# Patient Record
Sex: Male | Born: 1965 | Race: Black or African American | Hispanic: No | Marital: Married | Smoking: Former smoker
Health system: Southern US, Community
[De-identification: ages and names within clinical notes are randomized; demographics above are authoritative.]

## PROBLEM LIST (undated history)

## (undated) DIAGNOSIS — H409 Unspecified glaucoma: Secondary | ICD-10-CM

## (undated) DIAGNOSIS — I10 Essential (primary) hypertension: Secondary | ICD-10-CM

## (undated) HISTORY — DX: Unspecified glaucoma: H40.9

## (undated) HISTORY — DX: Essential (primary) hypertension: I10

---

## 1998-09-26 ENCOUNTER — Emergency Department (HOSPITAL_COMMUNITY): Admission: EM | Admit: 1998-09-26 | Discharge: 1998-09-26 | Payer: Self-pay | Admitting: Emergency Medicine

## 1998-11-08 ENCOUNTER — Emergency Department (HOSPITAL_COMMUNITY): Admission: EM | Admit: 1998-11-08 | Discharge: 1998-11-08 | Payer: Self-pay | Admitting: Emergency Medicine

## 1998-11-15 ENCOUNTER — Emergency Department (HOSPITAL_COMMUNITY): Admission: EM | Admit: 1998-11-15 | Discharge: 1998-11-15 | Payer: Self-pay | Admitting: Emergency Medicine

## 2000-02-08 ENCOUNTER — Emergency Department (HOSPITAL_COMMUNITY): Admission: EM | Admit: 2000-02-08 | Discharge: 2000-02-08 | Payer: Self-pay | Admitting: Emergency Medicine

## 2000-05-30 ENCOUNTER — Emergency Department (HOSPITAL_COMMUNITY): Admission: EM | Admit: 2000-05-30 | Discharge: 2000-05-30 | Payer: Self-pay | Admitting: Emergency Medicine

## 2000-08-05 ENCOUNTER — Encounter: Payer: Self-pay | Admitting: Emergency Medicine

## 2000-08-05 ENCOUNTER — Emergency Department (HOSPITAL_COMMUNITY): Admission: EM | Admit: 2000-08-05 | Discharge: 2000-08-05 | Payer: Self-pay | Admitting: Emergency Medicine

## 2000-10-14 ENCOUNTER — Emergency Department (HOSPITAL_COMMUNITY): Admission: EM | Admit: 2000-10-14 | Discharge: 2000-10-14 | Payer: Self-pay | Admitting: Emergency Medicine

## 2001-07-02 ENCOUNTER — Emergency Department (HOSPITAL_COMMUNITY): Admission: EM | Admit: 2001-07-02 | Discharge: 2001-07-02 | Payer: Self-pay | Admitting: *Deleted

## 2001-07-27 ENCOUNTER — Emergency Department (HOSPITAL_COMMUNITY): Admission: AC | Admit: 2001-07-27 | Discharge: 2001-07-27 | Payer: Self-pay

## 2001-07-28 ENCOUNTER — Emergency Department (HOSPITAL_COMMUNITY): Admission: EM | Admit: 2001-07-28 | Discharge: 2001-07-28 | Payer: Self-pay | Admitting: Emergency Medicine

## 2001-08-06 ENCOUNTER — Emergency Department (HOSPITAL_COMMUNITY): Admission: EM | Admit: 2001-08-06 | Discharge: 2001-08-06 | Payer: Self-pay | Admitting: Emergency Medicine

## 2001-08-26 ENCOUNTER — Emergency Department (HOSPITAL_COMMUNITY): Admission: EM | Admit: 2001-08-26 | Discharge: 2001-08-26 | Payer: Self-pay | Admitting: Emergency Medicine

## 2001-10-06 ENCOUNTER — Emergency Department (HOSPITAL_COMMUNITY): Admission: EM | Admit: 2001-10-06 | Discharge: 2001-10-06 | Payer: Self-pay | Admitting: Emergency Medicine

## 2001-11-20 ENCOUNTER — Inpatient Hospital Stay (HOSPITAL_COMMUNITY): Admission: EM | Admit: 2001-11-20 | Discharge: 2001-11-21 | Payer: Self-pay | Admitting: Emergency Medicine

## 2001-12-04 ENCOUNTER — Emergency Department (HOSPITAL_COMMUNITY): Admission: EM | Admit: 2001-12-04 | Discharge: 2001-12-04 | Payer: Self-pay | Admitting: Emergency Medicine

## 2002-03-08 ENCOUNTER — Emergency Department (HOSPITAL_COMMUNITY): Admission: EM | Admit: 2002-03-08 | Discharge: 2002-03-08 | Payer: Self-pay | Admitting: *Deleted

## 2002-03-09 ENCOUNTER — Encounter: Payer: Self-pay | Admitting: Emergency Medicine

## 2002-03-09 ENCOUNTER — Emergency Department (HOSPITAL_COMMUNITY): Admission: EM | Admit: 2002-03-09 | Discharge: 2002-03-09 | Payer: Self-pay | Admitting: Emergency Medicine

## 2002-03-11 ENCOUNTER — Encounter: Payer: Self-pay | Admitting: Emergency Medicine

## 2002-03-11 ENCOUNTER — Ambulatory Visit (HOSPITAL_COMMUNITY): Admission: RE | Admit: 2002-03-11 | Discharge: 2002-03-11 | Payer: Self-pay | Admitting: Emergency Medicine

## 2002-03-12 ENCOUNTER — Encounter: Payer: Self-pay | Admitting: Surgery

## 2002-03-12 ENCOUNTER — Emergency Department (HOSPITAL_COMMUNITY): Admission: EM | Admit: 2002-03-12 | Discharge: 2002-03-12 | Payer: Self-pay | Admitting: Emergency Medicine

## 2002-03-13 ENCOUNTER — Ambulatory Visit (HOSPITAL_COMMUNITY): Admission: RE | Admit: 2002-03-13 | Discharge: 2002-03-13 | Payer: Self-pay | Admitting: Internal Medicine

## 2011-01-05 ENCOUNTER — Emergency Department: Payer: Self-pay | Admitting: Emergency Medicine

## 2011-08-31 ENCOUNTER — Emergency Department: Payer: Self-pay | Admitting: *Deleted

## 2011-11-12 ENCOUNTER — Emergency Department: Payer: Self-pay | Admitting: Emergency Medicine

## 2012-04-18 ENCOUNTER — Emergency Department: Payer: Self-pay | Admitting: Emergency Medicine

## 2012-12-12 ENCOUNTER — Emergency Department: Payer: Self-pay | Admitting: Emergency Medicine

## 2013-06-24 ENCOUNTER — Inpatient Hospital Stay: Payer: Self-pay | Admitting: Internal Medicine

## 2013-06-24 LAB — COMPREHENSIVE METABOLIC PANEL
Albumin: 3.8 g/dL (ref 3.4–5.0)
Alkaline Phosphatase: 85 U/L
BUN: 16 mg/dL (ref 7–18)
Calcium, Total: 9.1 mg/dL (ref 8.5–10.1)
Co2: 28 mmol/L (ref 21–32)
Creatinine: 1.42 mg/dL — ABNORMAL HIGH (ref 0.60–1.30)
EGFR (African American): >60
EGFR (Non-African Amer.): 58 — ABNORMAL LOW
Glucose: 98 mg/dL (ref 65–99)
Osmolality: 275 (ref 275–301)
Potassium: 4.1 mmol/L (ref 3.5–5.1)
SGOT(AST): 27 U/L (ref 15–37)
SGPT (ALT): 32 U/L (ref 12–78)
Sodium: 137 mmol/L (ref 136–145)

## 2013-06-24 LAB — CBC
HCT: 43.4 % (ref 40.0–52.0)
HGB: 14.9 g/dL (ref 13.0–18.0)
MCHC: 34.3 g/dL (ref 32.0–36.0)
Platelet: 294 10*3/uL (ref 150–440)
RBC: 4.74 10*6/uL (ref 4.40–5.90)
RDW: 12.5 % (ref 11.5–14.5)
WBC: 7.7 10*3/uL (ref 3.8–10.6)

## 2013-06-24 LAB — CK-MB: CK-MB: 3.7 ng/mL — ABNORMAL HIGH (ref 0.5–3.6)

## 2013-06-24 LAB — TROPONIN I: Troponin-I: 0.31 ng/mL — ABNORMAL HIGH

## 2013-06-25 LAB — TROPONIN I: Troponin-I: 0.3 ng/mL — ABNORMAL HIGH

## 2013-06-25 LAB — LIPID PANEL
Ldl Cholesterol, Calc: 23 mg/dL (ref 0–100)
VLDL Cholesterol, Calc: 52 mg/dL — ABNORMAL HIGH (ref 5–40)

## 2013-06-25 LAB — BASIC METABOLIC PANEL
Chloride: 103 mmol/L (ref 98–107)
Co2: 28 mmol/L (ref 21–32)
Creatinine: 1.51 mg/dL — ABNORMAL HIGH (ref 0.60–1.30)
EGFR (Non-African Amer.): 53 — ABNORMAL LOW
Glucose: 109 mg/dL — ABNORMAL HIGH (ref 65–99)
Sodium: 140 mmol/L (ref 136–145)

## 2013-06-25 LAB — CREATININE, SERUM
EGFR (African American): >60
EGFR (Non-African Amer.): >60

## 2013-06-25 LAB — CK TOTAL AND CKMB (NOT AT ARMC): CK, Total: 289 U/L — ABNORMAL HIGH (ref 35–232)

## 2013-09-28 ENCOUNTER — Emergency Department: Payer: Self-pay | Admitting: Internal Medicine

## 2013-09-28 ENCOUNTER — Emergency Department: Payer: Self-pay | Admitting: Emergency Medicine

## 2013-09-28 LAB — COMPREHENSIVE METABOLIC PANEL
ALBUMIN: 3.7 g/dL (ref 3.4–5.0)
ANION GAP: 6 — AB (ref 7–16)
Alkaline Phosphatase: 81 U/L
BUN: 15 mg/dL (ref 7–18)
Bilirubin,Total: 0.2 mg/dL (ref 0.2–1.0)
Calcium, Total: 8.8 mg/dL (ref 8.5–10.1)
Chloride: 106 mmol/L (ref 98–107)
Co2: 26 mmol/L (ref 21–32)
Creatinine: 1.57 mg/dL — ABNORMAL HIGH (ref 0.60–1.30)
EGFR (African American): 59 — ABNORMAL LOW
EGFR (Non-African Amer.): 51 — ABNORMAL LOW
GLUCOSE: 95 mg/dL (ref 65–99)
Osmolality: 276 (ref 275–301)
Potassium: 3.7 mmol/L (ref 3.5–5.1)
SGOT(AST): 19 U/L (ref 15–37)
SGPT (ALT): 35 U/L (ref 12–78)
Sodium: 138 mmol/L (ref 136–145)
Total Protein: 7.5 g/dL (ref 6.4–8.2)

## 2013-09-28 LAB — CBC
HCT: 41.7 % (ref 40.0–52.0)
HGB: 13.9 g/dL (ref 13.0–18.0)
MCH: 31 pg (ref 26.0–34.0)
MCHC: 33.3 g/dL (ref 32.0–36.0)
MCV: 93 fL (ref 80–100)
PLATELETS: 267 10*3/uL (ref 150–440)
RBC: 4.48 10*6/uL (ref 4.40–5.90)
RDW: 13.1 % (ref 11.5–14.5)
WBC: 8.2 10*3/uL (ref 3.8–10.6)

## 2013-09-28 LAB — CK-MB
CK-MB: 5.3 ng/mL — ABNORMAL HIGH (ref 0.5–3.6)
CK-MB: 4.4 ng/mL — ABNORMAL HIGH (ref 0.5–3.6)

## 2013-09-28 LAB — DRUG SCREEN, URINE
Amphetamines, Ur Screen: NEGATIVE (ref ?–1000)
BENZODIAZEPINE, UR SCRN: NEGATIVE (ref ?–200)
Barbiturates, Ur Screen: NEGATIVE (ref ?–200)
CANNABINOID 50 NG, UR ~~LOC~~: POSITIVE (ref ?–50)
Cocaine Metabolite,Ur ~~LOC~~: POSITIVE (ref ?–300)
MDMA (ECSTASY) UR SCREEN: NEGATIVE (ref ?–500)
Methadone, Ur Screen: NEGATIVE (ref ?–300)
Opiate, Ur Screen: NEGATIVE (ref ?–300)
PHENCYCLIDINE (PCP) UR S: NEGATIVE (ref ?–25)
TRICYCLIC, UR SCREEN: NEGATIVE (ref ?–1000)

## 2013-09-28 LAB — TROPONIN I
TROPONIN-I: 0.41 ng/mL — AB
Troponin-I: 0.38 ng/mL — ABNORMAL HIGH

## 2014-07-27 ENCOUNTER — Emergency Department: Payer: Self-pay | Admitting: Emergency Medicine

## 2014-07-30 LAB — BETA STREP CULTURE(ARMC)

## 2014-08-25 IMAGING — CT CT HEAD WITHOUT CONTRAST
1 series · 16 of 30 positions shown, 20 images · non-contrast
Comparison: No priors.

CLINICAL DATA: Headache.

EXAM:
CT HEAD WITHOUT CONTRAST
TECHNIQUE: Contiguous axial images were obtained from the base of the skull
through the vertex without intravenous contrast.

[Series 2: head wo · axial · 0.48mm/px · z∈[-60,+92]mm · 16 of 36 slices shown, 20 images]
[im 2/36  brain]
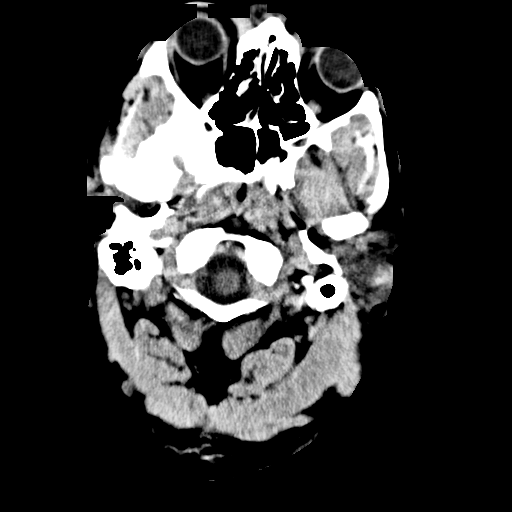
[im 2/36  bone]
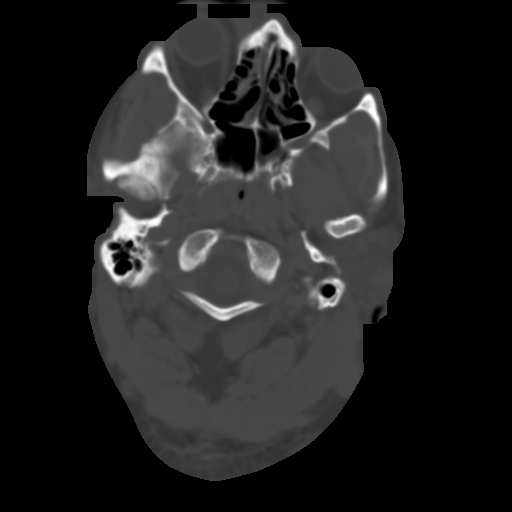
[im 4/36  brain]
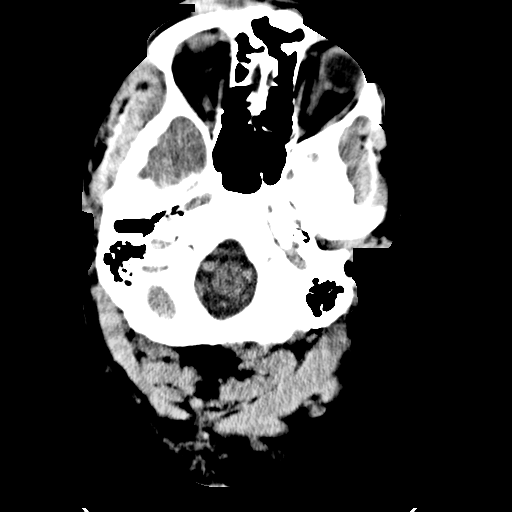
[im 7/36  brain]
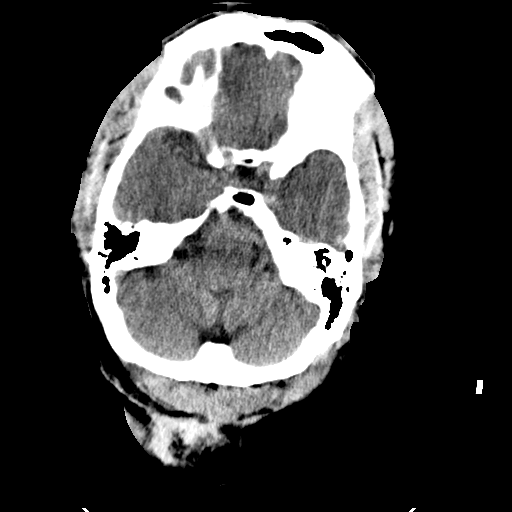
[im 9/36  brain]
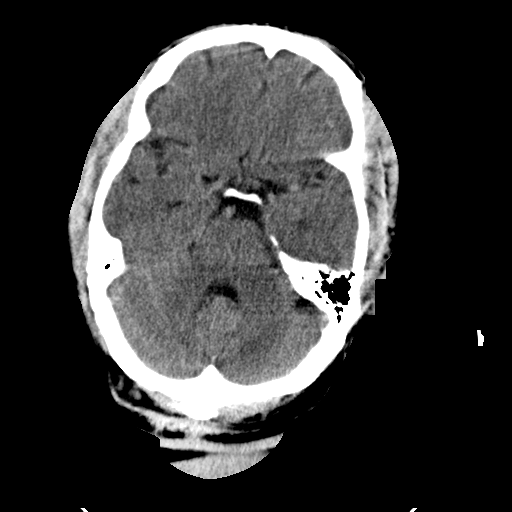
[im 10/36  brain]
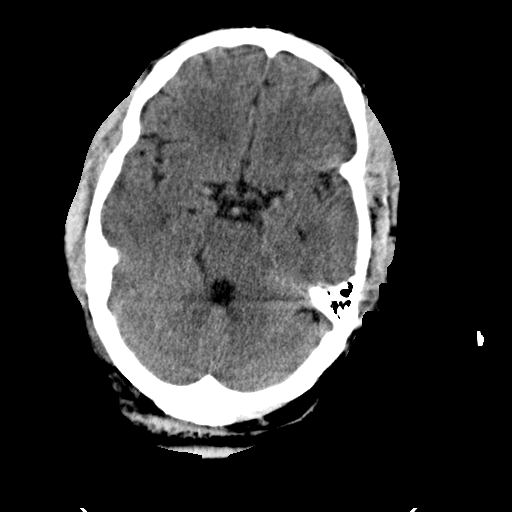
[im 10/36  bone]
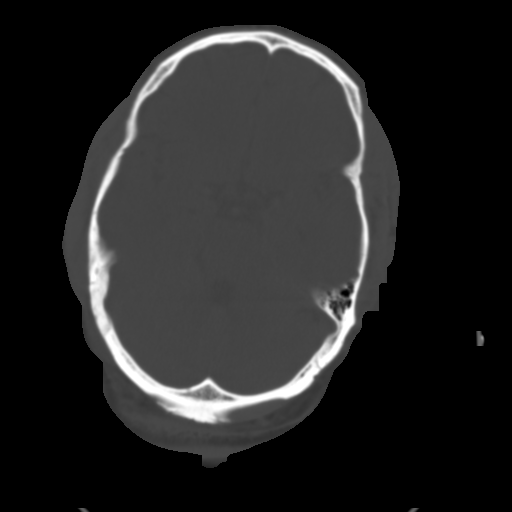
[im 13/36  brain]
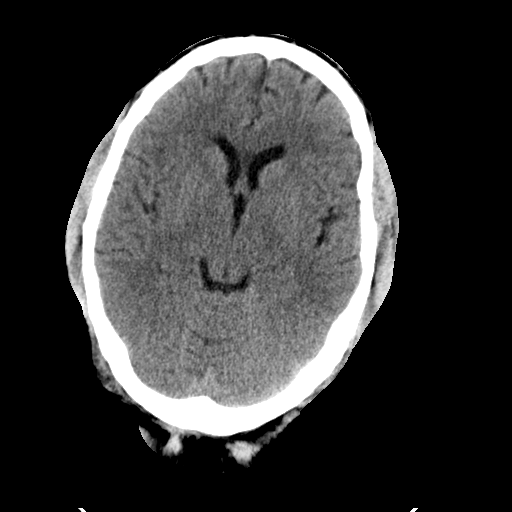
[im 15/36  brain]
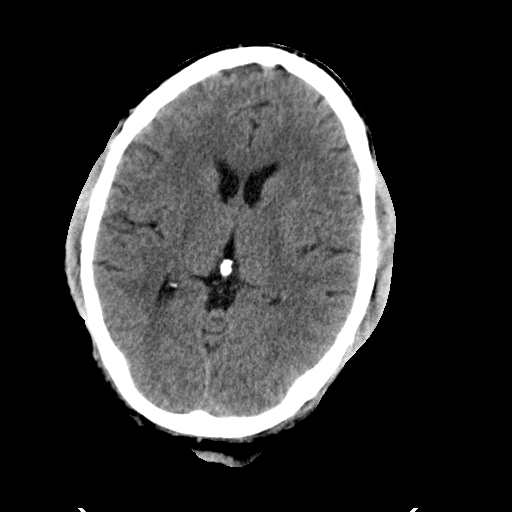
[im 17/36  brain]
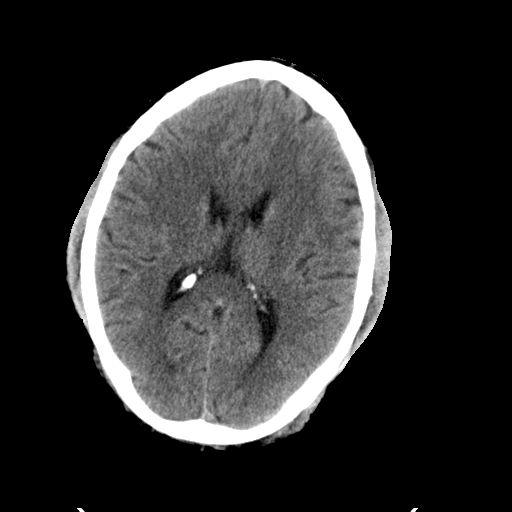
[im 19/36  brain]
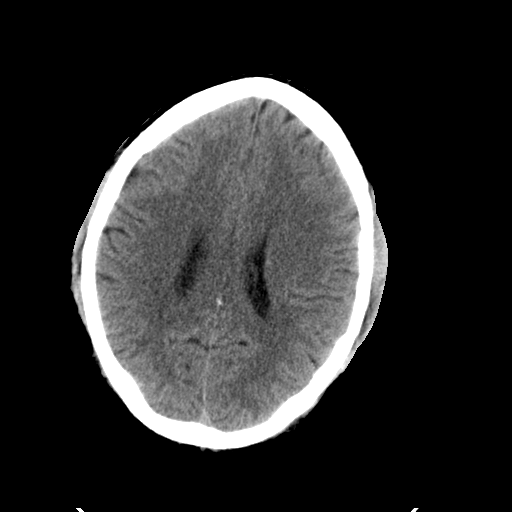
[im 19/36  bone]
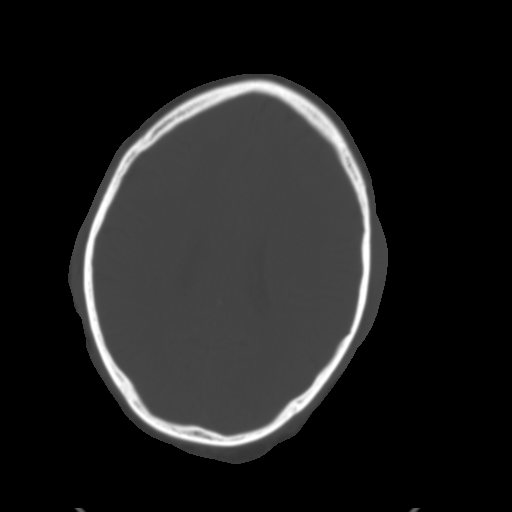
[im 21/36  brain]
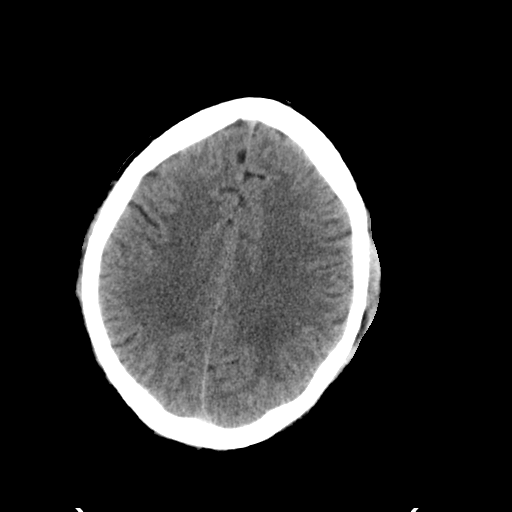
[im 23/36  brain]
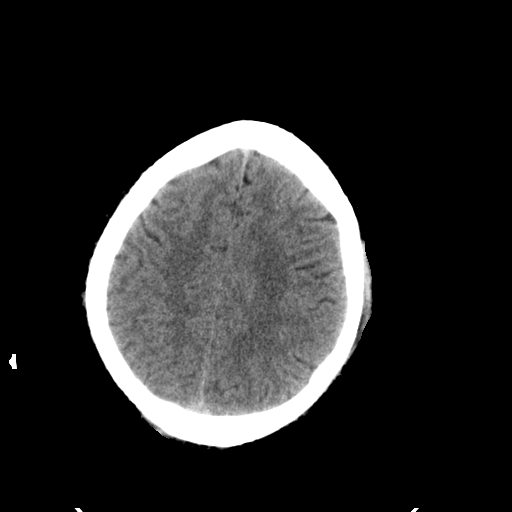
[im 26/36  brain]
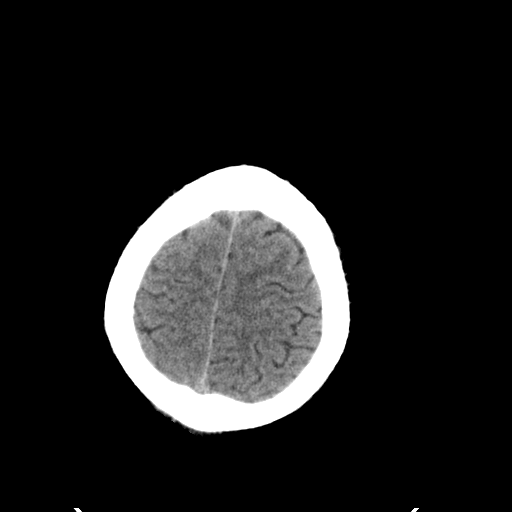
[im 27/36  brain]
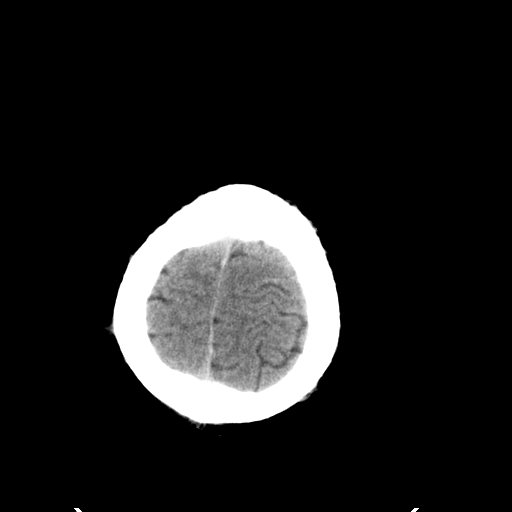
[im 27/36  bone]
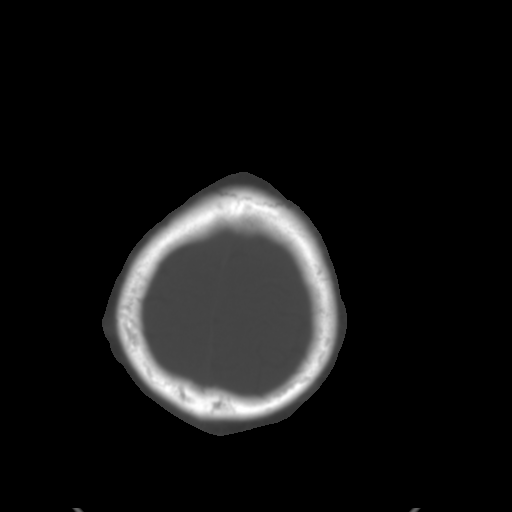
[im 29/36  brain]
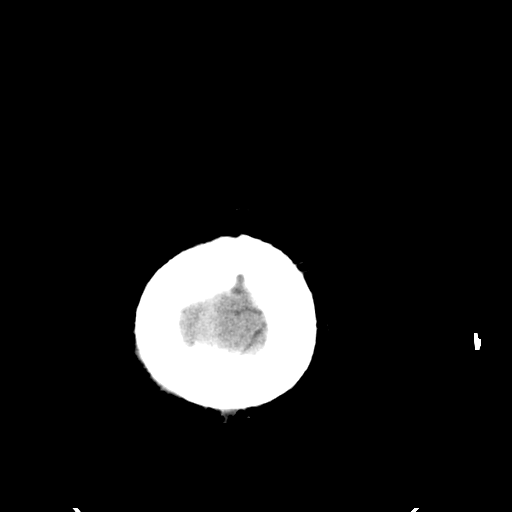
[im 32/36  brain]
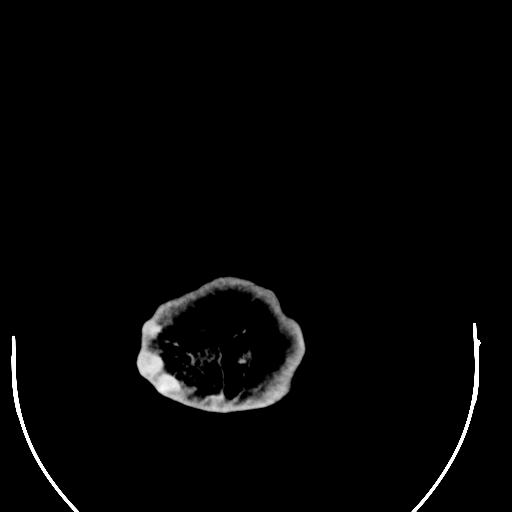
[im 34/36  brain]
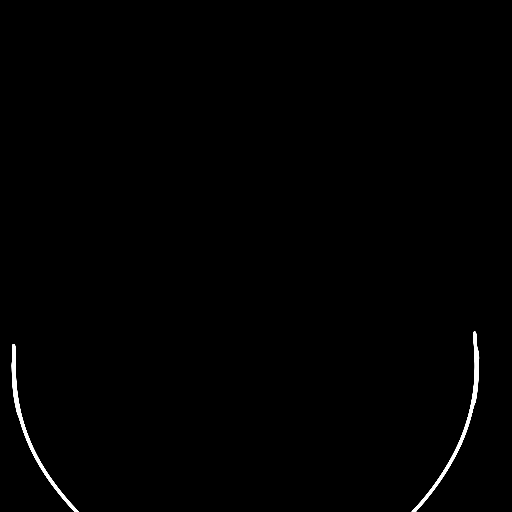

[16 of 30 positions shown; findings below may reference images not displayed]

FINDINGS: No acute intracranial abnormalities. Specifically, no evidence of
acute intracranial hemorrhage, no definite findings of
acute/subacute cerebral ischemia, no mass, mass effect,
hydrocephalus or abnormal intra or extra-axial fluid collections.
Tiny foci of low attenuation in the anterior limb of the left
internal capsule are very well-defined and could either reflect old
lacunar infarctions or dilated perivascular spaces. Visualized
paranasal sinuses and mastoids are well pneumatized. No acute
displaced skull fractures are identified.
IMPRESSION: * No acute intracranial abnormalities.
*Tiny old lacunar infarctions versus dilated perivascular spaces in
the anterior limb of left internal capsule.

## 2014-11-13 NOTE — H&P (Signed)
PATIENT NAMCordella Chung:  [name], Pleasant MR#:  [id number] DATE OF BIRTH:  Aug 26, 1965  DATE OF ADMISSION:  06/24/2013  PRIMARY CARE PROVIDER: None.   ED PHYSICIAN: Rockne MenghiniAnne-Caroline Norman, MD  CHIEF COMPLAINT: Headache.   HISTORY OF PRESENT ILLNESS: The patient is a 49 year old African American male with a long history of hypertension, has not been on blood pressure medication since 2010, when he was released from jail because he states that he ran out of the medications. He reports about a week ago he started having right sharp headache, usually when he woke up in the morning and then would persist for a few hours then go away. The patient continued to have these symptoms and he also has been having shortness of breath with exertion. Therefore, he came to the ED. When he arrived in the ED, his blood pressure was 195/102. The patient underwent evaluation for his symptoms. He also had an EKG which showed inferolateral T wave inversions and his troponin was noted to be 0.37. Therefore, we were asked to admit the patient. The patient denies any chest pain. He does complain of chest pain with exertion. Denies any nausea or vomiting. Denies any left-sided chest pain. Denies any palpitations or syncope. Denies any abdominal pain, nausea, vomiting. Denies any urinary symptoms.   PAST MEDICAL HISTORY: Significant for hypertension since 2008.   PAST SURGICAL HISTORY: None.   ALLERGIES: None.   CURRENT MEDICATIONS: None.   SOCIAL HISTORY: Smokes less than 1 pack per day. Drinks 1 beer once a month, uses marijuana.   FAMILY HISTORY: Uncle with heart disease.   REVIEW OF SYSTEMS: CONSTITUTIONAL: Denies any fevers, fatigue, weakness. Complains of headache. No weight loss or weight gain.  EYES: No blurred or double vision. No pain. No redness. No inflammation. He reports that he has cataract in the left eye.  ENT: Denies any tinnitus, ear pain. No hearing loss. No seasonal or year-round allergies. No epistaxis. No nasal  discharge. No difficulty swallowing.  RESPIRATORY:  Denies any cough, wheezing, hemoptysis.  CARDIOVASCULAR: Denies any chest pain, orthopnea, edema, arrhythmia. Complains of dyspnea on exertion.  GASTROINTESTINAL: No nausea, vomiting, diarrhea. No abdominal pain. No hematemesis. No melena. No ulcer. No GERD. No IBS. No jaundice.  GENITOURINARY: Denies any dysuria, hematuria, renal calc or frequency.  ENDOCRINE: Denies any polyuria, nocturia or thyroid problems.  HEMATOLOGIC AND LYMPHATIC: Denies anemia, easy bruisability or bleeding.  SKIN: No acne. No rash. No changes in hair, mole or skin.  MUSCULOSKELETAL: Denies any pain in the neck, back or shoulder.  NEUROLOGIC: No numbness. No CVA. No TIA.   PSYCHIATRIC: No anxiety. No insomnia. No ADD.   PHYSICAL EXAMINATION: VITAL SIGNS: Temperature 97.2, pulse 80, respirations 20, blood pressure 195/102.  GENERAL: The patient is an obese African American male in no acute distress.  HEENT: Head atraumatic, normocephalic. Pupils equally round, reactive to light and accommodation. There is no conjunctival pallor, no scleral icterus. Nasal exam shows no drainage or ulceration. Oropharynx is clear without any exudate.  NECK: Supple without any JVD.  CARDIOVASCULAR: Regular rate and rhythm. No murmurs, rubs, clicks or gallops. PMI is not displaced.  LUNGS: Clear to auscultation bilaterally without any rales, rhonchi, wheezing.  ABDOMEN: Soft, nontender, nondistended. Positive bowel sounds x 4. No hepatosplenomegaly.  MUSCULOSKELETAL: There is no erythema or swelling.  SKIN: He has some raised boil-like lesions on his back. No other rashes.  LYMPHATICS: No lymph nodes palpable.  MUSCULOSKELETAL: There is no erythema or swelling. NEUROLOGIC: Awake, alert, oriented x  3. No focal deficits.  PSYCHIATRIC: Not anxious or depressed.   LABORATORY, DIAGNOSTIC AND RADIOLOGICAL DATA: EKG shows inferolateral T wave changes. Chest x-ray shows normal chest x-ray.  BMP: Glucose 98, BUN 16, creatinine 1.42, sodium 137, potassium 4.1, chloride 107, CO2 is 28, calcium 9.1. LFTs are normal. CK-MB 3.5. Troponin 0.31. WBC 7.7, hemoglobin 14.9, platelet count was 294.   ASSESSMENT AND PLAN: The patient is a 49 year old African American male with history of hypertension, untreated since 2010, presents with headache and noted to have accelerated hypertension.  1.  Headache due to accelerated hypertension. At this time, we will treat him with clonidine,  HCTZ and atenolol. We will do p.r.n. hydralazine. Hold ACE inhibitor in light of his elevated creatinine.  2.  Elevated cardiac enzymes could be due to accelerated hypertension but does have T wave inversions. Will go ahead and schedule a Leximibi in the a.m. Aspirin therapy.  3.  Nicotine addiction, 4 minutes spent. Nicotine patch offered. The patient is recommended to stop smoking.   NOTE: 50 minutes spent on this H and P.     ____________________________ Lacie Scotts. Allena Katz, MD shp:cs D: 06/24/2013 19:30:02 ET T: 06/24/2013 20:02:03 ET JOB#: [id number]  cc: Breckin Zafar H. Allena Katz, MD, <Dictator> Charise Carwin MD ELECTRONICALLY SIGNED 07/04/2013 8:58

## 2014-11-13 NOTE — Discharge Summary (Signed)
PATIENT NAMEKYNGSTON, [name] MR#:  [id number] DATE OF BIRTH:  21-Dec-1965  DATE OF ADMISSION:  06/24/2013 DATE OF DISCHARGE:  06/25/2013  ADMITTING DIAGNOSIS:  Malignant hypertension.   DISCHARGE DIAGNOSES: 1.  Malignant hypertension.  2.  Headache, likely hypertensive encephalopathy.  3.  Elevated troponin, no acute coronary syndrome.  4.  Acute renal failure, resolving with IV fluids.  5.  Hyperlipidemia.  6.  Hypertriglyceridemia.  7.  Obesity.   DISCHARGE CONDITION: Stable.   DISCHARGE MEDICATIONS: The patient is to start clonidine 0.2 mg p.o. twice daily, atenolol 50 mg p.o. daily, HCTZ 25 mg p.o. once daily, nicotine transdermal patch 21 mg topically daily.   HOME OXYGEN: None.   DIET: Two grams salt, low fat, low cholesterol. The patient was advised to cut out all fried food from his diet.    ACTIVITY LIMITATIONS: As tolerated.    FOLLOWUP APPOINTMENT:  With [facility] in 2 days after discharge present.   CONSULTANTS: Care management, social work.   RADIOLOGIC STUDIES: Chest x-ray, AP and lateral, 2nd of December 2014, showed no active cardiopulmonary disease.   HOSPITAL COURSE:  The patient is a 49 year old African American male with history of hypertension, who presented to the hospital with complaints of headaches. Please refer to Dr. Serita Grit Chung's admission note on 2nd of December 2014. On arrival to the Emergency Room, the patient's blood pressure was noted to be 195/102. His troponin was also noted to be elevated at 0.37. Physical exam was unremarkable. The patient's EKG and showed inferolateral T wave changes, T depressions. The patient's lab data revealed elevation of creatinine to 1.42, otherwise BMP was unremarkable. Liver enzymes were normal. Cardiac enzymes, first set showed troponin of 0.31, second set is 0.31, third set 0.30. The patient's TSH was normal at 0.647. CBC was within normal limits. The patient was admitted to the hospital for further evaluation.  His cardiac enzymes were cycled and he underwent Myoview stress test. If Myoview stress test is unremarkable, normal, the patient will likely be discharged home.   In regards to malignant hypertension, the patient was initiated on blood pressure medications and the patient's blood pressure was much better controlled. On the day of discharge, the patient's blood pressure is 133/89, saturation was 97% on room air at rest. His heart rate was between 60 and 63, and he was afebrile with temperature of 98. He was advised to establish primary physician at the [facility] and be seen there and continue his blood pressure medications.   In regards to acute renal failure, the patient was continued on IV fluids while he was in the hospital.  His creatinine somewhat improved to 1.35 on the day of discharge, 3rd of December 2014, but significantly so.  It was felt that the patient's mild renal insufficiency could have been chronic, not necessarily acute; however, we felt that the patient's creatinine should be checked as outpatient and make decision about nephrology consultation if needed but the most important thing was blood pressure control.   In regards to elevated troponin, cardiologist did not feel that was acute coronary syndrome. Cardiac evaluation was done on the 3rd of December 2014, however, results are still pending at the time of dictation. If stress test is normal, the patient will likely be discharged home today.   In regards to acute renal failure, as mentioned above the patient received some IV fluids while he was in the hospital. He had mild changes in his creatinine levels; however, it is  difficult to say if in fact, his renal failure is acute or chronic. We checked patient's lipid panel since we were concerned about possible cardiovascular disease. The patient's LDL was found to be 23. The patient's total cholesterol was 108, however, triglycerides were markedly elevated at 260 and the HDL was  33. We advised the patient to cut out all fatty food from his diet and lose weight if possible.   In regards to headaches, we felt that the patient's headache was very likely hypertensive encephalopathy. The patient's headache resolved whenever the patient's malignant hypertension was treated.  The patient is being discharged as soon as the patient's Myoview stress test is report is back and if it is negative.   VITAL SIGNS: Of discharge, temperature was 98, pulse was 63. Respiratory rate was 16, blood pressure 133/89, saturation was 97% on room air at rest.    ____________________________ [name] Breanna Shorkey, MD rv:cs D: 06/25/2013 16:15:00 ET T: 06/25/2013 18:59:26 ET JOB#: [id number]  cc: [name] Omah Dewalt, MD, <Dictator> [facility] Henry Altmann MD ELECTRONICALLY SIGNED 07/02/2013 15:34

## 2014-11-14 NOTE — Consult Note (Signed)
PATIENT NAMECORNELIS, [name] MR#:  [id number] DATE OF BIRTH:  Jul 19, 1966  DATE OF CONSULTATION:  09/28/2013  REFERRING PHYSICIAN:  Sheryl L. Mindi Junker, MD, from ER  CONSULTING PHYSICIAN:  Hope Pigeon. Elisabeth Pigeon, MD  CHIEF COMPLAINT: Headache.   HISTORY OF PRESENTING ILLNESS: A 49 year old male who is having a long history of hypertension but has not been on hypertension medication for the last few years as he was initially in jail but last few years, after he was released, he never established a primary care physician and was not taking it. He was in the Emergency Room and admitted to the hospital in December 2014 for similar complaint with uncontrolled hypertension with headache and elevated troponin. Cardiac workup including echocardiogram and stress test was done and they were reported to be negative for any findings other than having hypertrophic cardiomyopathy. He was discharged home with prescriptions of hypertensive medication and advised to follow in clinic but he did not go to any clinic as he did not have insurance and for the last 2 months not taking any medication after he ran out of the prescriptions. Yesterday, he was doing okay but in the evening and nighttime, he started having severe headache and did not have good sleep. He decided to come to the Emergency Room today morning. He also reports that he had cocaine use yesterday. He came to Emergency Room and found having blood pressure was on the high side, systolic was 179 and diastolic was 102. His troponin was noted 0.4 and so he asked to be admitted to hospitalist team for further evaluation. On my questioning, the patient denies any chest pain or shortness of breath or any numbness or weakness, focal areas. He says that he has left shoulder pain but it is chronic and kind of bothers him most of the time and he has to keep his shoulder resting in  position whenever he is sleeping and not moving much to avoid the pain but that is chronic to  him and not changed recently. He denies any visual changes or any dizziness. No nausea and vomiting.   REVIEW OF SYSTEMS:   CONSTITUTIONAL: Negative for fever, fatigue, weakness, pain or weight loss.  EYES: No blurring, double vision, discharge or redness.  EARS, NOSE, THROAT: No tinnitus, ear pain or hearing loss.  RESPIRATORY: No cough, wheezing, shortness of breath.  CARDIOVASCULAR: No chest pain, orthopnea, edema, arrhythmia or palpitations.  GASTROINTESTINAL: No nausea, vomiting, diarrhea, abdominal pain.  GENITOURINARY: No dysuria, hematuria or increased frequency.  ENDOCRINE: No heat or cold intolerance. No increased sweating.  NEUROLOGICAL: No numbness, weakness, tremor or vertigo.  PSYCHIATRIC: No anxiety, insomnia, bipolar disorder.   PAST MEDICAL HISTORY:  Hypertension since 2008, cardiomyopathy due to uncontrolled hypertension as evidenced by echocardiogram in December 2014   PAST SURGICAL HISTORY: None.   CURRENT MEDICATIONS: None.   SOCIAL HISTORY: He was a smoker, 1 pack per day, but stopped smoking since December 2014. Drinks 1 beer occasionally and he uses marijuana and cocaine frequently. Last cocaine use was yesterday.   FAMILY HISTORY: Uncle had heart disease.   PHYSICAL EXAMINATION: VITAL SIGNS: In ER, temperature 97.9, pulse 60, respirations 18, blood pressure 179/102 on presentation which is still 178/91 and oxygen saturation 98% on room air.  GENERAL: The patient is fully alert and oriented to time, place and person. Does not appear in any acute distress.  HEENT: Head and neck atraumatic. Conjunctiva pink. Oral mucosa moist.  NECK: Supple. No JVD.  RESPIRATORY: Bilateral clear  and equal air entry.  CARDIOVASCULAR: S1, S2 present, regular. No murmur.  ABDOMEN: Soft, nontender. Bowel sounds present. No organomegaly.  SKIN: No rashes.  LEGS: No edema.  NEUROLOGICAL: Power 5 out of 5. Follows commands. Moves all 4 limbs. No gross abnormality.  JOINTS: No  swelling or tenderness.  PSYCHIATRIC: Does not appear in any acute psychiatric illness at this time.   LABORATORY, DIAGNOSTIC AND RADIOLOGICAL DATA:  Glucose 95, BUN 15, creatinine 1.57, sodium 138, potassium 3.7, chloride 106, CO2 26, calcium is 8.8. Troponin is 0.41 in the past, in December 2014, his troponin was 0.3 all the time. WBC 8.2, hemoglobin 13.9, platelet count is 267. Echocardiogram was done in December 2014 which is concentric left ventricular hypertrophy. Stress test was done in December 2014 which was negative for any acute ischemia. EKG reviewed by me is having some left ventricular hypertrophic changes and residual T wave abnormality which is same as compared to December 2014, does not appear to be having any new changes in the EKG since then.   ASSESSMENT AND PLAN: A 49 year old male who is hypertensive since long time, not taking medication regularly and having hypertensive cardiomyopathy, concentric left ventricular hypertrophy, noncompliance to medication, came to hospital after having cocaine use last night and having headache and elevated blood pressure. Right now, headache is coming under control with interventions in the ER. Troponin is slightly elevated, same as in the past, and EKG is same as in the past. Negative stress test in the recent past, 3 months ago.  1.  Uncontrolled hypertension. I counseled him to keep taking his blood pressure medication regularly and start following with 1 of the doctors in the clinic.  If he had insurance issues, then he can follow with [facility] which is free to noninsured people and I gave him prescription for his hypertensive medication for next 6 months which he can fill up at  Medicine with pharmacist.   2.  Elevated troponin. This is chronic for him. In December, his troponin was 0.3 all 3 times checked and today it is 0.4. This is most likely due to concentric hypertrophy secondary to hypertension and mild renal failure and uncontrolled  hypertension. EKG is not changed from his baseline so I would recommend to follow 1 more troponin and, if it is not rising, then he can be safely discharged back home.  3.  Chronic renal failure stage 3. His creatinine is always in the range of 0.4 to 0.5 and creatinine clearance is around 50s, compared to previous same. Advised to take blood pressure medication regularly and have followup with his clinic.  Now he claims that he has insurance and he would start following with a doctor regularly.  4.  Cocaine use. Counseled for not using the street drugs and cocaine. He understands the severity of that and agrees to not use it in future. Currently, he does not appear to be in any acute distress because of that and so he can be safely discharged.  5.  Headache. Give 1 dose of morphine for now and that should help to relieve his headache as blood pressure is also coming under control and I am changing his blood pressure medication from atenolol to lisinopril because of his cocaine use and it might not be safe to give him beta blocker with that. Heart rate also running in 50s and 60s in the Emergency Room so would like to avoid beta blockers.   TOTAL TIME SPENT ON THIS CONSULT: Is  45 minutes.   Spoke to ER physician about the patient's condition and plan and he can be discharged after following second troponin. They understand and agree with the plan. Prescriptions will be given to patient on discharge.   Thanks for allowing me to participate in the care of this patient.    ____________________________ Hope Pigeon. Elisabeth Pigeon, MD vgv:cs D: 09/28/2013 11:03:04 ET T: 09/28/2013 14:41:53 ET JOB#: [id number]  cc: Hope Pigeon. Elisabeth Pigeon, MD, <Dictator> Altamese Dilling MD ELECTRONICALLY SIGNED 10/07/2013 22:45

## 2014-11-14 NOTE — Consult Note (Signed)
PATIENT NAMCordella Chung:  Mom, Yehonatan MR#:  [id number] DATE OF BIRTH:  10/07/1965  DATE OF CONSULTATION:  06/25/2013  REFERRING PHYSICIAN:  Dr. Precious BardSharon Patel CONSULTING PHYSICIAN:  Najib Colmenares D. Rhian Asebedo, MD  INDICATION:  Abnormal EKG, malignant hypertension, chest pain.   HISTORY OF PRESENT ILLNESS:  The patient is a 49 year old African American male with a history of hypertension, long-standing blood pressure problems, but has been noncompliant with medications. He has not been able to afford them. The patient came to the Emergency Room with systolic blood pressure around 200 and underwent evaluation and found to have a slightly elevated troponin. He had had mild chest pain and some shortness of breath. No blackout spells or syncope so he has referred for cardiac evaluation. He denies prior cardiac history except for hypertension.   PAST MEDICAL HISTORY:  Hypertension and noncompliance.   PAST SURGICAL HISTORY:  None.   ALLERGIES:  None.   MEDICATIONS:  None.   SOCIAL HISTORY:  A smoker, drinks beer, occasional substance abuser.   FAMILY HISTORY:  Positive for cardiac disease.  REVIEW OF SYSTEMS:  No blackout spells or syncope. Denies nausea or vomiting. Denies fever, chills, sweats, weight loss, weight gain, hemoptysis, hematemesis. No bright-red blood per rectum. No vision change or hearing change. Denies sputum production. Denies cough.   PHYSICAL EXAMINATION:  VITAL SIGNS:  Blood pressure was initially 200/100, pulse of 80, respiratory rate of 16. HEART EXAM:  Systolic ejection murmur. Positive S4. PMI nondisplaced.  ABDOMEN:  Benign.  EXTREMITIES:  Within normal limits.  NEUROLOGIC: Intact.  SKIN: Normal.  LABORATORY AND DIAGNOSTICS:  1.  EKG: Normal sinus rhythm, diffuse ST segment and T-wave changes.  2.  CHEST X-RAY:  Normal: 3.  Glucose 98, BUN is 16, creatinine 1.4, sodium 137, potassium 4.1, chloride of 107, CO2 28, calcium 9.1. LFTs are negative. Troponin 0.31. White count of 7.7,  hemoglobin of 14.9, platelet count of 294.   ASSESSMENT:  1.  Possible angina.  2.  Abnormal EKG. 3.  Malignant hypertension.  4.  Noncompliance.  5.  Substance abuse.  6.  Elevated troponin with possible non-Q-wave myocardial infarction. 7.  Smoking.   PLAN:  1.  Agree with admit. Rule out for myocardial infarction. Followup cardiac enzymes. Followup EKGs. Consider anticoagulation. Consider functional study versus cardiac catheterization.  2.  For hypertension, continue strict hypertension control. He will probably requiring multiple medications and noncompliance will be an issue.  3.  For smoking. Continue to have the patient refrain from smoking.  4.  For substance abuse. Have the patient refrain from substance abuse including alcohol consumption.  5.  Recommend weight loss and exercise portion control.  6.  Have the patient follow up with his primary physician for further evaluation and therapy. Strict blood pressure control as well will be necessary for the patient.  ____________________________ Bobbie Stackwayne D. Juliann Paresallwood, MD ddc:jm D: 06/26/2013 12:08:00 ET T: 06/26/2013 12:56:19 ET JOB#: [id number]  cc: Dorrene Bently D. Juliann Paresallwood, MD, <Dictator> Alwyn PeaWAYNE D Nalaya Wojdyla MD ELECTRONICALLY SIGNED 07/30/2013 10:29

## 2024-03-17 ENCOUNTER — Emergency Department
Admission: EM | Admit: 2024-03-17 | Discharge: 2024-03-17 | Disposition: A | Discharge status: Home / Self Care | Attending: Emergency Medicine | Admitting: Emergency Medicine

## 2024-03-17 ENCOUNTER — Encounter: Payer: Self-pay | Admitting: Emergency Medicine

## 2024-03-17 ENCOUNTER — Other Ambulatory Visit: Payer: Self-pay

## 2024-03-17 DIAGNOSIS — Z760 Encounter for issue of repeat prescription: Secondary | ICD-10-CM | POA: Insufficient documentation

## 2024-03-17 DIAGNOSIS — I10 Essential (primary) hypertension: Secondary | ICD-10-CM | POA: Insufficient documentation

## 2024-03-17 MED ORDER — AMLODIPINE BESYLATE 10 MG PO TABS: 10.0000 mg | ORAL_TABLET | Freq: Every day | ORAL | 2 refills | Status: AC

## 2024-03-17 NOTE — ED Notes (Signed)
 Pt discharged at this time. RN reviewed discharge instructions with pt. Pt verbalized understanding. Vital signs taken. RR even and unlabored. Pt denies any questions or needs at this time. Pt ambulatory at discharge.

## 2024-03-17 NOTE — Discharge Instructions (Addendum)
 I sent a prescription for your amlodipine  to your pharmacy.  Please arrange follow-up with a primary care doctor for further evaluation.  Return to the ER for new or worsening symptoms.

## 2024-03-17 NOTE — ED Provider Notes (Signed)
 [facility] Provider Note    Event Date/Time   First MD Initiated Contact with Patient 03/17/24 [phone]     (approximate)   History   Medication Refill   HPI  [name] is a 58 year old male with history of hypertension presenting to the emergency department for evaluation of elevated blood pressure.  Patient was recently in residential substance use treatment facility at [facility].  He was sent home with a short course of blood pressure medication, amlodipine  10 mg, but ran out of this about a week ago.  He denies chest pain, shortness of breath, headache, numbness, tingling, focal weakness, other complaints.  Working on getting established with a primary care doctor.     Physical Exam   Triage Vital Signs: ED Triage Vitals  Encounter Vitals Group     BP 03/17/24 0622 (!) 168/100     Girls Systolic BP Percentile --      Girls Diastolic BP Percentile --      Boys Systolic BP Percentile --      Boys Diastolic BP Percentile --      Pulse Rate 03/17/24 0622 61     Resp 03/17/24 0622 20     Temp 03/17/24 0622 98.1 F (36.7 C)     Temp Source 03/17/24 0622 Oral     SpO2 03/17/24 0622 98 %     Weight 03/17/24 0621 274 lb (124.3 kg)     Height 03/17/24 0621 6' 1 (1.854 m)     Head Circumference --      Peak Flow --      Pain Score 03/17/24 0621 0     Pain Loc --      Pain Education --      Exclude from Growth Chart --     Most recent vital signs: Vitals:   03/17/24 0622  BP: (!) 168/100  Pulse: 61  Resp: 20  Temp: 98.1 F (36.7 C)  SpO2: 98%     General: Awake, interactive  CV:  Regular rate, good peripheral perfusion.  Resp:  Unlabored respirations, lungs clear to auscultation Abd:  Nondistended.  Neuro:  Symmetric facial movement, fluid speech   ED Results / Procedures / Treatments   Labs (all labs ordered are listed, but only abnormal results are displayed) Labs Reviewed - No data to display   EKG EKG independently reviewed  and interpreted by myself demonstrates:    RADIOLOGY Imaging independently reviewed and interpreted by myself demonstrates:   Formal Radiology Read:  No results found.  PROCEDURES:  Critical Care performed: No  Procedures   MEDICATIONS ORDERED IN ED: Medications - No data to display   IMPRESSION / MDM / ASSESSMENT AND PLAN / ED COURSE  I reviewed the triage vital signs and the nursing notes.  Differential diagnosis includes, but is not limited to, uncontrolled hypertension secondary to medication nonadherence, no evidence of hypertensive emergency  Patient's presentation is most consistent with acute, uncomplicated illness.  58 year old male presenting with elevated blood pressure in the setting of not having access to his medications.  Moderate hypertension here, clinical history and degree of hypertension not reflective of hypertensive emergency.  Patient does have his old prescription bottle, taking amlodipine  10 mg daily.  Refill for amlodipine  sent to his preferred pharmacy.  Referral placed to primary care and given a list of PCPs in the area.  Strict return precautions provided.  Patient discharged stable condition.    FINAL CLINICAL IMPRESSION(S) / ED DIAGNOSES  Final diagnoses:  Uncontrolled hypertension  Medication refill     Rx / DC Orders   ED Discharge Orders          Ordered    amLODipine  (NORVASC ) 10 MG tablet  Daily        03/17/24 0703    Ambulatory Referral to Primary Care (Establish Care)        03/17/24 0704             Note:  This document was prepared using Dragon voice recognition software and may include unintentional dictation errors.   Levander Slate, MD 03/17/24 [phone]

## 2024-03-17 NOTE — ED Triage Notes (Addendum)
 Patient requesting refill on blood pressure medication. Patient reports he just got out of treatment facility in July and has been unable to establish a primary care doctor. He is requesting a refill of his amlodipine  until he can get a primary care doctor. Denies pain or medical complaint.

## 2024-04-11 ENCOUNTER — Ambulatory Visit

## 2024-04-11 VITALS — BP 142/91 | HR 71 | Ht 73.0 in | Wt 280.5 lb

## 2024-04-11 DIAGNOSIS — Z136 Encounter for screening for cardiovascular disorders: Secondary | ICD-10-CM

## 2024-04-11 DIAGNOSIS — R011 Cardiac murmur, unspecified: Secondary | ICD-10-CM | POA: Insufficient documentation

## 2024-04-11 DIAGNOSIS — R609 Edema, unspecified: Secondary | ICD-10-CM | POA: Insufficient documentation

## 2024-04-11 DIAGNOSIS — R079 Chest pain, unspecified: Secondary | ICD-10-CM | POA: Diagnosis not present

## 2024-04-11 DIAGNOSIS — F1911 Other psychoactive substance abuse, in remission: Secondary | ICD-10-CM | POA: Insufficient documentation

## 2024-04-11 DIAGNOSIS — Z122 Encounter for screening for malignant neoplasm of respiratory organs: Secondary | ICD-10-CM

## 2024-04-11 DIAGNOSIS — I10 Essential (primary) hypertension: Secondary | ICD-10-CM | POA: Insufficient documentation

## 2024-04-11 DIAGNOSIS — Z87891 Personal history of nicotine dependence: Secondary | ICD-10-CM | POA: Insufficient documentation

## 2024-04-11 DIAGNOSIS — E669 Obesity, unspecified: Secondary | ICD-10-CM | POA: Insufficient documentation

## 2024-04-11 NOTE — Progress Notes (Signed)
 New Patient Visit   Physician: Sanjana Folz A Taylan Mayhan, MD  Patient: [name]   DOB: 1966-05-17   58 y.o. Male  MRN: [id number] Visit Date: 04/11/2024   Chief Complaint  Patient presents with   Establish Care   Subjective  [name] is a 58 y.o. male who presents today as a new patient to establish care.   HPI  Discussed the use of AI scribe software for clinical note transcription with the patient, who gave verbal consent to proceed.  History of Present Illness   [name] is a 58 year old male with hypertension who presents with leg swelling and chest pain.  Peripheral edema - Swelling in legs and feet for the last months - No associated shortness of breath or cough  Chest pain - Heaviness or pain localized to the left side of the chest - Episodes last approximately 30 seconds - Pain resolves with deep breathing and time  - Not reproducible - No associated cough, shortness of breath, or palpitations - but he does not typically exert himslef  - Does not exercise, FH of CAD unknown  Hypertension - Currently taking amlodipine  10 mg for blood pressure control and hydrochlorothiazide 25 mg - Recent missed doses of amlodipine  - Blood pressure measured at 142/91 mmHg during visit  Substance use - History of intermittent crack cocaine use, particularly during periods not incarcerated  - Rehab release more recently and he stays in programs to remain off drugs - No crack cocaine use since rehab - release from prison in 2010 - Strong support system in place to maintain sobriety He reports having hepatitis, HIV screens done  Sleep and occupational history - Works night shifts and sleeps approximately six hours per day - Feels rested upon waking and has adjusted to night shift schedule - Works daily, including weekends, and volunteers for extra shifts     Diet could be improved overall Maternal FH breast cancer    ASSESSMENT & PLAN  Encounter Diagnoses  Name  Primary?   History of substance abuse (HCC) Yes   Obesity (BMI 35.0-39.9 without comorbidity)    Former smoker    Chest pain, unspecified type    Pitting edema    Hypertension, unspecified type    Cardiac murmur     No orders of the defined types were placed in this encounter.   Assessment and Plan    Hypertension Elevated blood pressure at 142/91 mmHg. Non-adherence to amlodipine  noted. Discussed medication adherence to prevent complications. - Ensure adherence to amlodipine  10 mg daily and HCTZ - Encourage home blood pressure monitoring. - Reassess blood pressure at next visit.  Lower extremity edema Significant pitting edema observed. Possible causes include hypertension, cardiac issues, or medication side effects. - Order echocardiogram to assess heart function. CMP for renal function eval, BNP  Chest pain/murmur Murmur detected, requires evaluation for significance and impact on cardiac function. - Order echocardiogram to evaluate murmur and cardiac function.  - Cardiac stress test treadmill  - Fu if any recurrence of pain  - Ddx includes any pulmonary pathology in differential as former smoker.   Higher risk for AAA  - Medical history generally unknown with intermittent care  - Overall high risk due to lengthy history of crack cocaine use.   Crack cocaine use Abstinent with support system. Discussed long-term cardiovascular effects and importance of monitoring. - Monitor cardiovascular health closely. - Encourage continued abstinence and support system engagement.  - AAA screen due in addition to status  as former smoker  Obesity Discussed weight management, dietary modifications, and physical activity. Emphasized lifestyle changes over medication. - Encourage dietary modifications and increased physical activity. - Schedule fasting lab work to assess metabolic parameters.   AAA ultrasound screen for aneurysm Low dose CT lung cancer screen  EKG when he comes in for  labs - missing staff today     Patient declines influenza vaccine.     F/u if any change in chest pain or symptoms. Needs BP check next visit - he may need cuff at home.  F/u to review labs in three weeks.    Objective  BP (!) 142/91 (BP Location: Left Arm, Patient Position: Sitting, Cuff Size: Large)   Pulse 71   Ht 6' 1 (1.854 m)   Wt 280 lb 8 oz (127.2 kg)   SpO2 95%   BMI 37.01 kg/m      Review of Systems  Constitutional:  Negative for chills, fever and weight loss.  Eyes:  Negative for blurred vision. h Respiratory:  Negative for cough and shortness of breath.   Cardiovascular:  Negative for chest pain and palpitations.  Skin:  Negative for rash.  Psychiatric/Behavioral:  Negative for depression. The patient is not nervous/anxious.      Physical Exam Physical Exam Vitals reviewed.  Constitutional:      Appearance: Normal appearance. Well-developed with normal weight.  HENT:     Head: Normocephalic and atraumatic.  Normal mucous membranes, no oral lesions Eyes:     Pupils: Pupils are equal, round, and reactive to light.  Neck:     Thyroid: No thyroid mass or thyromegaly.  Cardiovascular:     Rate and Rhythm: Normal rate and regular rhythm. Grade II systolic murmur RUSB, Normal peripheral pulses +2 pitting edema to mid leg bilat Pulmonary:     Normal breath sounds with normal effort Abdominal:   Abdomen is soft, without tenderness or noted hepatosplenomegaly Musculoskeletal:        General: No swelling or edema  Lymphadenopathy:     Cervical: No cervical adenopathy.  Skin:    General: Skin is warm and dry without noticeable rash. Multiple tattoos Neurological:     General: No focal deficit present.  Psychiatric:        Mood and Affect: Mood, behavior and cognition normal   Past Medical History:  Diagnosis Date   Glaucoma    Hypertension    Past Surgical History:  Procedure Laterality Date   WRIST ARTHROSCOPY Right    Family Status  Relation Name  Status   Mother  Deceased  No partnership data on file   Family History  Problem Relation Age of Onset   Cancer Mother    Social History   Socioeconomic History   Marital status: Married    Spouse name: Not on file   Number of children: Not on file   Years of education: Not on file   Highest education level: Not on file  Occupational History   Not on file  Tobacco Use   Smoking status: Former    Types: Cigarettes   Smokeless tobacco: Never  Vaping Use   Vaping status: Never Used  Substance and Sexual Activity   Alcohol use: Never   Drug use: Not Currently    Types: Crack cocaine   Sexual activity: Yes  Other Topics Concern   Not on file  Social History Narrative   Not on file   Social Drivers of Health   Financial Resource Strain: Not on  file  Food Insecurity: Not on file  Transportation Needs: Not on file  Physical Activity: Not on file  Stress: Not on file  Social Connections: Not on file   Outpatient Medications Prior to Visit  Medication Sig   amLODipine  (NORVASC ) 10 MG tablet Take 1 tablet (10 mg total) by mouth daily.   hydrochlorothiazide (HYDRODIURIL) 25 MG tablet Take 25 mg by mouth daily.   No facility-administered medications prior to visit.   No Known Allergies   There is no immunization history on file for this patient.  Health Maintenance  Topic Date Due   HIV Screening  Never done   Hepatitis C Screening  Never done   DTaP/Tdap/Td (1 - Tdap) Never done   Colonoscopy  Never done   Pneumococcal Vaccine: 50+ Years (1 of 1 - PCV) Never done   Zoster Vaccines- Shingrix (1 of 2) Never done   Influenza Vaccine  Never done   COVID-19 Vaccine (1 - 2024-25 season) Never done   Hepatitis B Vaccines 19-59 Average Risk  Aged Out   HPV VACCINES  Aged Out   Meningococcal B Vaccine  Aged Out    Patient Care Team: Patient, No Pcp Per as PCP - General (General Practice)  Depression Screen     No data to display           Parris DELENA Juneau, MD  [facility] [phone] (phone) [phone] (fax)  [facility]

## 2024-04-22 NOTE — Progress Notes (Deleted)
  Cardiology Office Note   Date:  04/22/2024  ID:  [name], DOB 1965/09/28, MRN [id number] PCP: Everlene Parris LABOR, MD  [facility] Providers Cardiologist:  None { Click to update primary MD,subspecialty MD or APP then REFRESH:1}    History of Present Illness [name] is a 58 y.o. male with past medical history of substance abuse, obesity, former smoker, peripheral edema, hypertension, heart murmur, who presents today to establish care.  Recently was evaluated in the [facility] emergency department on 03/17/2024 requesting controlled substance use treatment facility at [facility].  He was sent home with a short course of blood pressure medication of amlodipine  10 mg daily but ran out approximately a week ago.  He denied any other associated symptoms.  Unfortunately he has been working on getting established with a primary care provider.  Initial vital signs vital signs are 168/100, pulse 61, respirations of 20, temperature 98.1.  Refill of amlodipine  was sent to his preferred pharmacy and a referral was placed to primary care provider and given PCP list.  Strict return precautions were provided.  Establish with new primary care provider on 04/11/2024.At that time he he had complained of chest discomfort he described as a heaviness with pain localized to the left side of his chest having episodes lasting approximately 30 seconds.  He states that the pain resolved with deep breathing and time.  Stated the pain was not reproducible.  He had no associated symptoms.  Had also noted swelling into his legs and feet for the last several months.  Cardiac murmur was detected on examination.  PCP ordered an echocardiogram and a stress test.  He was also referred to cardiology.  He presents today to establish care.  ROS: 10 point review of systems has been reviewed and considered negative except ones are listed in the HPI  Studies Reviewed      Echocardiogram and treadmill stress test is  pending Risk Assessment/Calculations   No BP recorded.  {Refresh Note OR Click here to enter BP  :1}***       Physical Exam VS:  There were no vitals taken for this visit.       Wt Readings from Last 3 Encounters:  04/11/24 280 lb 8 oz (127.2 kg)  03/17/24 274 lb (124.3 kg)    GEN: Well nourished, well developed in no acute distress NECK: No JVD; No carotid bruits CARDIAC: ***RRR, no murmurs, rubs, gallops RESPIRATORY:  Clear to auscultation without rales, wheezing or rhonchi  ABDOMEN: Soft, non-tender, non-distended EXTREMITIES:  No edema; No deformity   ASSESSMENT AND PLAN Atypical chest pain Shortness of breath/lower extremity edema Heart murmur Hypertension Substance abuse Obesity    {Are you ordering a CV Procedure (e.g. stress test, cath, DCCV, TEE, etc)?   Press F2        :[id number]}  Dispo: ***  Signed, Daylyn Azbill, NP

## 2024-04-23 ENCOUNTER — Ambulatory Visit: Attending: Cardiology | Admitting: Cardiology

## 2024-04-25 ENCOUNTER — Other Ambulatory Visit

## 2024-04-25 DIAGNOSIS — Z87891 Personal history of nicotine dependence: Secondary | ICD-10-CM

## 2024-04-25 DIAGNOSIS — E669 Obesity, unspecified: Secondary | ICD-10-CM

## 2024-04-25 DIAGNOSIS — F1911 Other psychoactive substance abuse, in remission: Secondary | ICD-10-CM

## 2024-04-25 DIAGNOSIS — R079 Chest pain, unspecified: Secondary | ICD-10-CM

## 2024-04-25 DIAGNOSIS — R609 Edema, unspecified: Secondary | ICD-10-CM

## 2024-04-25 DIAGNOSIS — R011 Cardiac murmur, unspecified: Secondary | ICD-10-CM

## 2024-04-25 DIAGNOSIS — I10 Essential (primary) hypertension: Secondary | ICD-10-CM

## 2024-04-26 LAB — LIPID PANEL
Cholesterol: 133 mg/dL (ref <200)
HDL: 48 mg/dL (ref >=40)
LDL Cholesterol (Calc): 71 mg/dL
Non-HDL Cholesterol (Calc): 85 mg/dL (ref <130)
Total CHOL/HDL Ratio: 2.8 (calc) (ref <5.0)
Triglycerides: 64 mg/dL (ref <150)

## 2024-04-26 LAB — COMPREHENSIVE METABOLIC PANEL WITH GFR
AG Ratio: 1.7 (calc) (ref 1.0–2.5)
ALT: 34 U/L (ref 9–46)
AST: 27 U/L (ref 10–35)
Albumin: 4.7 g/dL (ref 3.6–5.1)
Alkaline phosphatase (APISO): 68 U/L (ref 35–144)
BUN: 24 mg/dL (ref 7–25)
CO2: 31 mmol/L (ref 20–32)
Calcium: 9.8 mg/dL (ref 8.6–10.3)
Chloride: 103 mmol/L (ref 98–110)
Creat: 1.31 mg/dL (ref 0.70–1.35)
Globulin: 2.7 g/dL (ref 1.9–3.7)
Glucose, Bld: 103 mg/dL — ABNORMAL HIGH (ref 65–99)
Potassium: 4.3 mmol/L (ref 3.5–5.3)
Sodium: 142 mmol/L (ref 135–146)
Total Bilirubin: 0.6 mg/dL (ref 0.2–1.2)
Total Protein: 7.4 g/dL (ref 6.1–8.1)
eGFR: 62 mL/min/1.73m2 (ref >=60)

## 2024-04-26 LAB — URINALYSIS, ROUTINE W REFLEX MICROSCOPIC
Bilirubin Urine: NEGATIVE
Glucose, UA: NEGATIVE
Hgb urine dipstick: NEGATIVE
Ketones, ur: NEGATIVE
Leukocytes,Ua: NEGATIVE
Nitrite: NEGATIVE
Protein, ur: NEGATIVE
Specific Gravity, Urine: 1.023 (ref 1.001–1.035)
pH: 6 (ref 5.0–8.0)

## 2024-04-26 LAB — CBC WITH DIFFERENTIAL/PLATELET
Absolute Lymphocytes: 1066 {cells}/uL (ref 850–3900)
Absolute Monocytes: 638 {cells}/uL (ref 200–950)
Basophils Absolute: 38 {cells}/uL (ref 0–200)
Basophils Relative: 0.8 %
Eosinophils Absolute: 197 {cells}/uL (ref 15–500)
Eosinophils Relative: 4.1 %
HCT: 42.4 % (ref 38.5–50.0)
Hemoglobin: 14.4 g/dL (ref 13.2–17.1)
MCH: 32 pg (ref 27.0–33.0)
MCHC: 34 g/dL (ref 32.0–36.0)
MCV: 94.2 fL (ref 80.0–100.0)
MPV: 9.2 fL (ref 7.5–12.5)
Monocytes Relative: 13.3 %
Neutro Abs: 2861 {cells}/uL (ref 1500–7800)
Neutrophils Relative %: 59.6 %
Platelets: 241 Thousand/uL (ref 140–400)
RBC: 4.5 Million/uL (ref 4.20–5.80)
RDW: 12.5 % (ref 11.0–15.0)
Total Lymphocyte: 22.2 %
WBC: 4.8 Thousand/uL (ref 3.8–10.8)

## 2024-04-26 LAB — HEMOGLOBIN A1C
Hgb A1c MFr Bld: 5.6 % (ref <5.7)
Mean Plasma Glucose: 114 mg/dL
eAG (mmol/L): 6.3 mmol/L

## 2024-04-30 ENCOUNTER — Telehealth: Payer: Self-pay

## 2024-04-30 ENCOUNTER — Ambulatory Visit: Admission: RE | Admit: 2024-04-30 | Source: Ambulatory Visit

## 2024-04-30 DIAGNOSIS — Z122 Encounter for screening for malignant neoplasm of respiratory organs: Secondary | ICD-10-CM

## 2024-04-30 DIAGNOSIS — Z87891 Personal history of nicotine dependence: Secondary | ICD-10-CM

## 2024-04-30 NOTE — Telephone Encounter (Signed)
 Lung Cancer Screening Narrative/Criteria Questionnaire (Cigarette Smokers Only- No Cigars/Pipes/vapes)   [name]   SDMV:10/22/205 8:00 am [name]        June 25, 1966               LDCT: 05/15/2024  8:00 am [facility]IC    58 y.o.   Phone: [phone]  Lung Screening Narrative (confirm age 59-77 yrs Medicare / 50-80 yrs Private pay insurance)   Insurance information: Medicaid   Referring Provider: Everlene   This screening involves an initial phone call with a team member from our program. It is called a shared decision making visit. The initial meeting is required by  insurance and Medicare to make sure you understand the program. This appointment takes about 15-20 minutes to complete. You will complete the screening scan at your scheduled date/time.  This scan takes about 5-10 minutes to complete. You can eat and drink normally before and after the scan.  Criteria questions for Lung Cancer Screening:   Are you a current or former smoker? Former Age began smoking: 14   If you are a former smoker, what year did you quit smoking? Quit 1 1/2 years ago (within 15 yrs)   To calculate your smoking history, I need an accurate estimate of how many packs of cigarettes you smoked per day and for how many years. (Not just the number of PPD you are now smoking)   Years smoking 42.5 x Packs per day 1 = Pack years 42.5   (at least 20 pack yrs)   (Make sure they understand that we need to know how much they have smoked in the past, not just the number of PPD they are smoking now)  Do you have a personal history of cancer?  No    Do you have a family history of cancer? Yes  (cancer type and and relative) Mother had breast cancer.   Are you coughing up blood?  No  Have you had unexplained weight loss of 15 lbs or more in the last 6 months? No  It looks like you meet all criteria.  When would be a good time for us  to schedule you for this screening?   Additional information: N/A

## 2024-05-02 ENCOUNTER — Ambulatory Visit

## 2024-05-02 NOTE — Progress Notes (Deleted)
            Progress Note  Physician: Cal Gindlesperger A Nazanin Kinner, MD   HPI: [name] is a 58 y.o. male presenting on 05/02/2024 for No chief complaint on file. .  Discussed the use of AI scribe software for clinical note transcription with the patient, who gave verbal consent to proceed.  History of Present Illness             Medical history:  Relevant past medical, surgical, family and social history reviewed and updated as indicated. Interim medical history since our last visit reviewed.  Allergies and medications reviewed and updated.   ROS: Negative unless specifically indicated above in HPI.    Current Outpatient Medications:    amLODipine  (NORVASC ) 10 MG tablet, Take 1 tablet (10 mg total) by mouth daily., Disp: 30 tablet, Rfl: 2   hydrochlorothiazide (HYDRODIURIL) 25 MG tablet, Take 25 mg by mouth daily., Disp: , Rfl:        Objective:     There were no vitals taken for this visit.  Wt Readings from Last 3 Encounters:  04/11/24 280 lb 8 oz (127.2 kg)  03/17/24 274 lb (124.3 kg)    Physical Exam  Physical Exam Vitals reviewed.  Constitutional:      Appearance: Normal appearance. Well-developed with normal weight.  Cardiovascular:     Rate and Rhythm: Normal rate and regular rhythm. Normal heart sounds. Normal peripheral pulses Pulmonary:     Normal breath sounds with normal effort Skin:    General: Skin is warm and dry without noticeable rash. Neurological:     General: No focal deficit present.  Psychiatric:        Mood and Affect: Mood, behavior and cognition normal      Assessment & Plan:  No diagnosis found.  No orders of the defined types were placed in this encounter.    Assessment and Plan

## 2024-05-14 ENCOUNTER — Encounter

## 2024-05-15 ENCOUNTER — Ambulatory Visit: Admission: RE | Admit: 2024-05-15 | Source: Ambulatory Visit

## 2024-05-28 ENCOUNTER — Ambulatory Visit: Payer: Self-pay

## 2024-06-05 ENCOUNTER — Ambulatory Visit: Payer: Self-pay | Admitting: Cardiology

## 2024-06-17 ENCOUNTER — Ambulatory Visit: Payer: Self-pay

## 2024-06-23 ENCOUNTER — Ambulatory Visit: Payer: Self-pay | Admitting: Cardiovascular Disease

## 2024-06-23 ENCOUNTER — Telehealth: Payer: Self-pay

## 2024-06-23 NOTE — Transitions of Care (Post Inpatient/ED Visit) (Unsigned)
   06/23/2024  Name: [name] MRN: [id number] DOB: 1966-03-14  Today's TOC FU Call Status: Today's TOC FU Call Status:: Unsuccessful Call (1st Attempt) Unsuccessful Call (1st Attempt) Date: 06/23/24  Attempted to reach the patient regarding the most recent Inpatient/ED visit.  Follow Up Plan: Additional outreach attempts will be made to reach the patient to complete the Transitions of Care (Post Inpatient/ED visit) call.   Signature  Charmaine Bloodgood, LPN [facility] Health Advisor [facility] You Are. We Are. [facility] Direct Dial [phone]

## 2024-06-24 NOTE — Transitions of Care (Post Inpatient/ED Visit) (Signed)
   06/24/2024  Name: [name] MRN: [id number] DOB: 1965/10/27  Today's TOC FU Call Status: Today's TOC FU Call Status:: Successful TOC FU Call Completed Unsuccessful Call (1st Attempt) Date: 06/23/24 [facility] FU Call Complete Date: 06/24/24  Patient's Name and Date of Birth confirmed. Name, DOB  Transition Care Management Follow-up Telephone Call Date of Discharge: 06/22/24 Discharge Facility: Other [facility]) Name of Other (Non[facility]) Discharge Facility: [facility] Type of Discharge: Inpatient Admission Primary Inpatient Discharge Diagnosis:: cellulitis How have you been since you were released from the hospital?: Better Any questions or concerns?: No  Items Reviewed: Did you receive and understand the discharge instructions provided?: Yes Medications obtained,verified, and reconciled?: Yes (Medications Reviewed) Any new allergies since your discharge?: No Dietary orders reviewed?: Yes Do you have support at home?: Yes People in Home [RPT]: spouse  Medications Reviewed Today: Medications Reviewed Today     Reviewed by Emmitt Pan, LPN (Licensed Practical Nurse) on 06/24/24 at 1542  Med List Status: <None>   Medication Order Taking? Sig Documenting Provider Last Dose Status Informant  amLODipine  (NORVASC ) 10 MG tablet [id number] Yes Take 1 tablet (10 mg total) by mouth daily. Levander Slate, MD  Active   hydrochlorothiazide (HYDRODIURIL) 25 MG tablet [id number] Yes Take 25 mg by mouth daily. [provider]  Active             Home Care and Equipment/Supplies: Were Home Health Services Ordered?: NA Any new equipment or medical supplies ordered?: NA  Functional Questionnaire: Do you need assistance with bathing/showering or dressing?: No Do you need assistance with meal preparation?: No Do you need assistance with eating?: No Do you have difficulty maintaining continence: No Do you need assistance with getting out of bed/getting out of a chair/moving?:  No Do you have difficulty managing or taking your medications?: No  Follow up appointments reviewed: PCP Follow-up appointment confirmed?: Yes Date of PCP follow-up appointment?: 07/03/24 Follow-up Provider: [facility] Follow-up appointment confirmed?: NA Do you need transportation to your follow-up appointment?: No Do you understand care options if your condition(s) worsen?: Yes-patient verbalized understanding    SIGNATURE Pan Emmitt, LPN [facility] Nurse Health Advisor Direct Dial [phone]

## 2024-07-03 ENCOUNTER — Inpatient Hospital Stay: Payer: Self-pay
